# Patient Record
Sex: Male | Born: 1985 | Race: Black or African American | Hispanic: No | Marital: Single
Health system: Southern US, Community
[De-identification: ages and names within clinical notes are randomized; demographics above are authoritative.]

## PROBLEM LIST (undated history)

## (undated) DIAGNOSIS — Z789 Other specified health status: Secondary | ICD-10-CM

---
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
## 2011-03-01 ENCOUNTER — Emergency Department: Payer: Self-pay | Admitting: Emergency Medicine

## 2012-04-03 ENCOUNTER — Emergency Department: Payer: Self-pay | Admitting: Emergency Medicine

## 2012-07-16 ENCOUNTER — Emergency Department: Payer: Self-pay | Admitting: Emergency Medicine

## 2015-02-04 ENCOUNTER — Emergency Department: Payer: Self-pay | Admitting: Emergency Medicine

## 2015-02-08 ENCOUNTER — Emergency Department: Payer: Self-pay | Admitting: Emergency Medicine

## 2015-09-06 IMAGING — CR RIGHT ANKLE - COMPLETE 3+ VIEW
1 series · 3 of 3 positions shown · non-contrast
Comparison: None

CLINICAL DATA: Injured playing basketball today, swelling and pain
RIGHT ankle

EXAM:
RIGHT ANKLE - COMPLETE 3+ VIEW

[Series 1: x ankle ap right · 0.14mm/px · 3 of 3 slices shown]
[im 1/3]
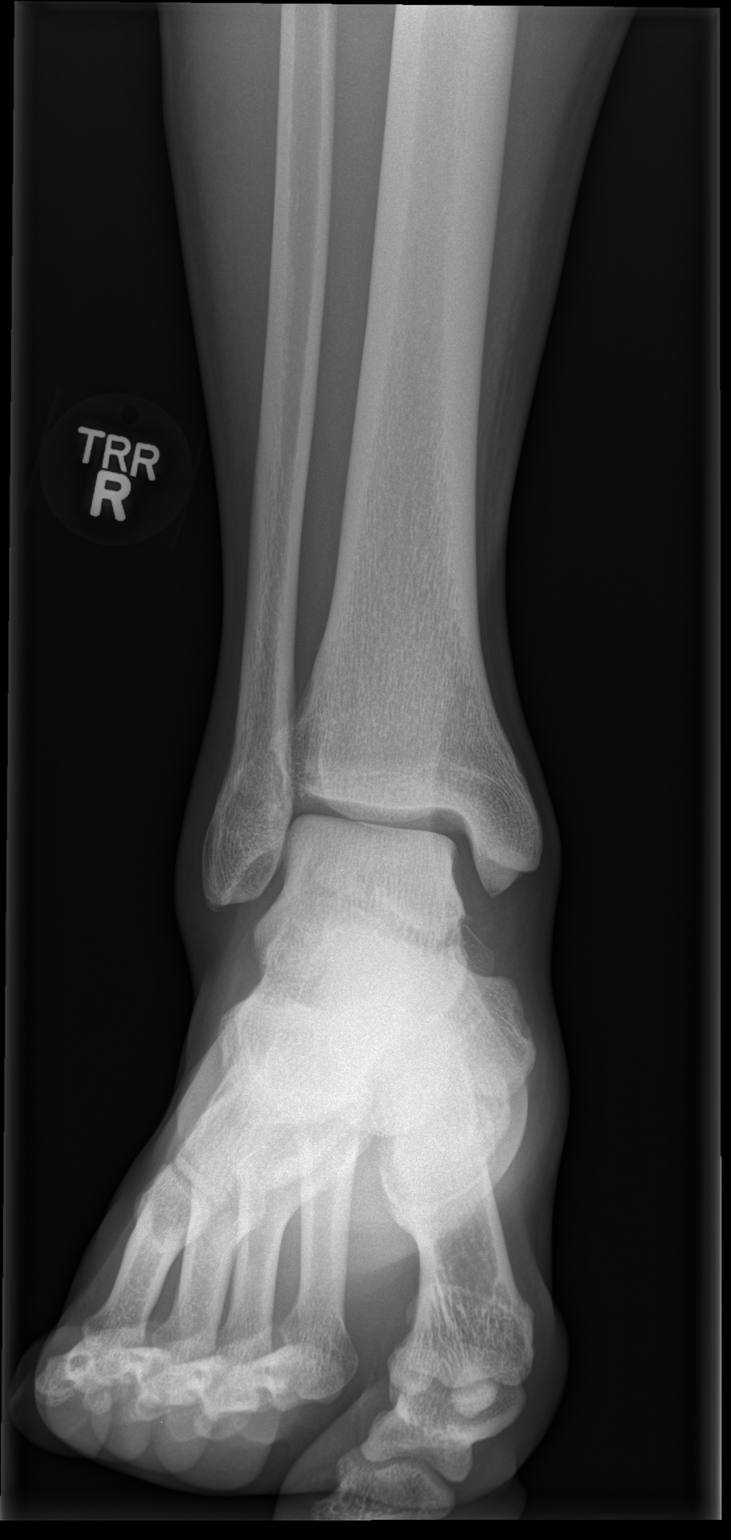
[im 2/3]
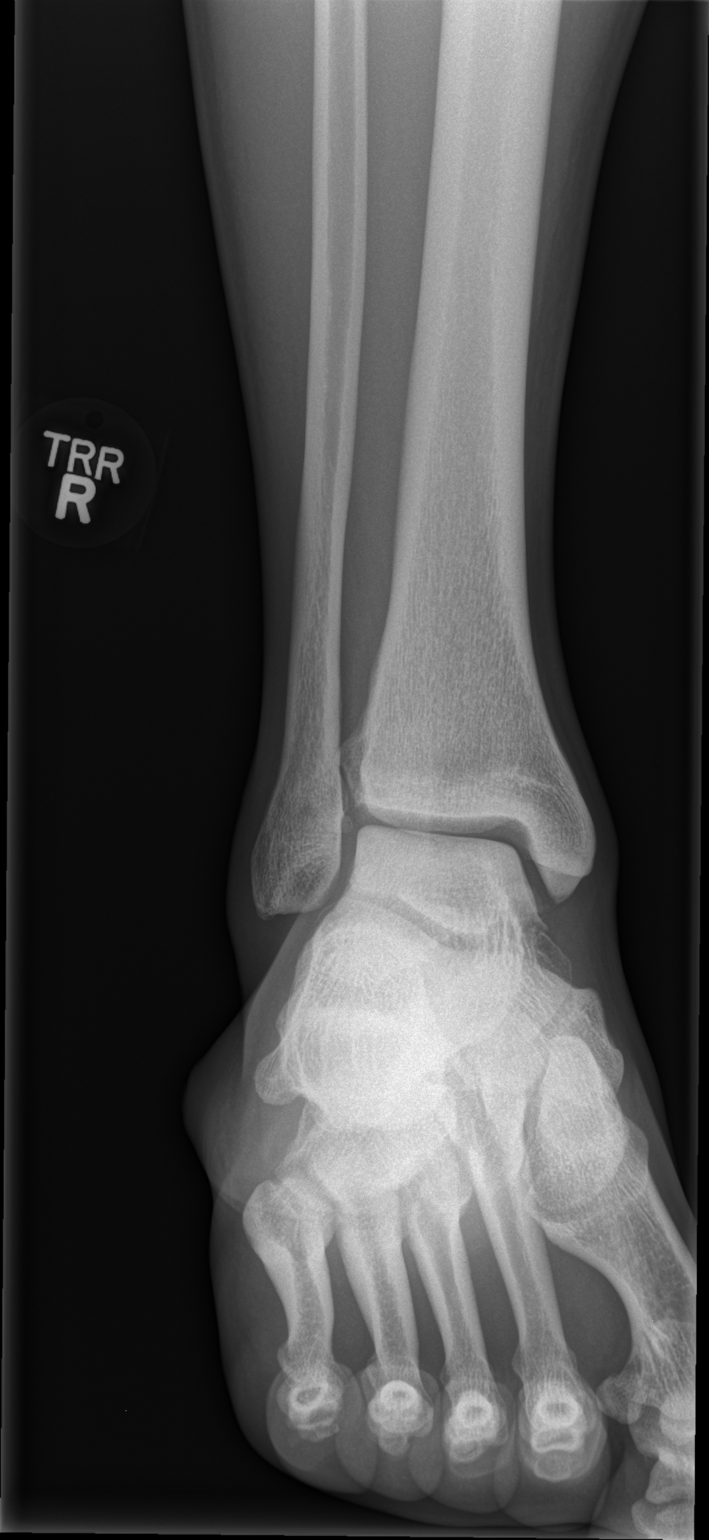
[im 3/3]
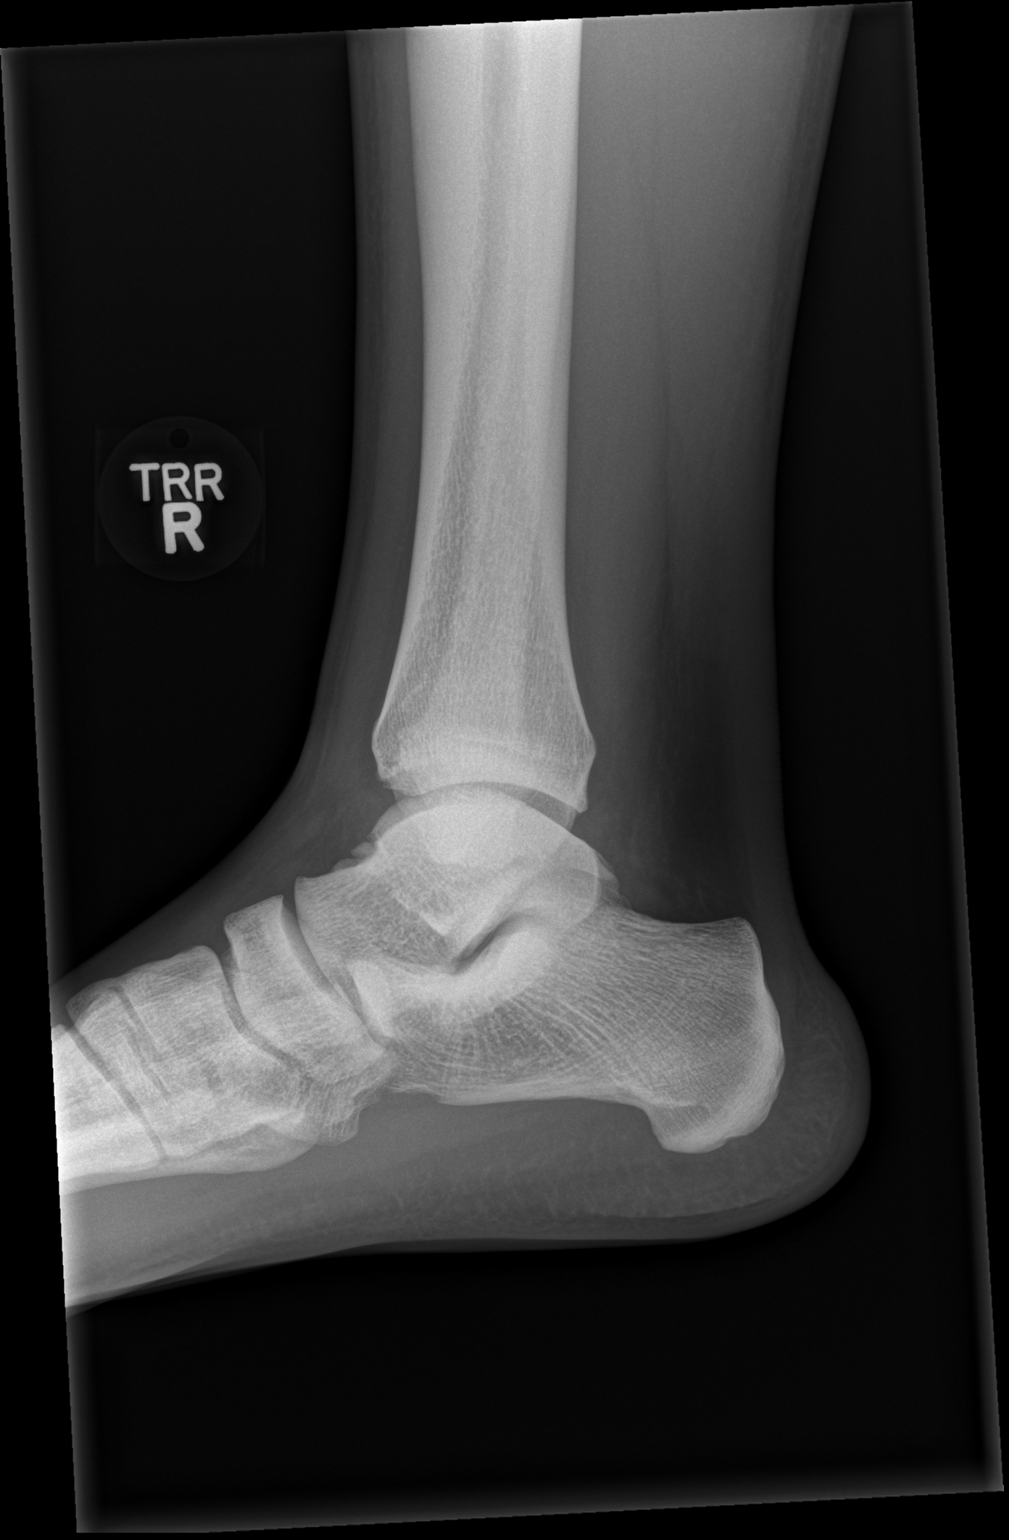

[3 of 3 positions shown; findings below may reference images not displayed]

FINDINGS: Osseous mineralization normal.

Ankle joint spaces preserved.

Minimal soft tissue swelling.

No definite acute fracture, dislocation, or bone destruction.
IMPRESSION: No acute osseous abnormalities.

## 2016-02-03 ENCOUNTER — Emergency Department
Admission: EM | Admit: 2016-02-03 | Discharge: 2016-02-03 | Disposition: A | Discharge status: Home / Self Care | Payer: Self-pay | Attending: Emergency Medicine | Admitting: Emergency Medicine

## 2016-02-03 ENCOUNTER — Emergency Department: Payer: Self-pay

## 2016-02-03 DIAGNOSIS — S86012A Strain of left Achilles tendon, initial encounter: Secondary | ICD-10-CM

## 2016-02-03 DIAGNOSIS — S86002A Unspecified injury of left Achilles tendon, initial encounter: Secondary | ICD-10-CM | POA: Insufficient documentation

## 2016-02-03 DIAGNOSIS — W500XXA Accidental hit or strike by another person, initial encounter: Secondary | ICD-10-CM | POA: Insufficient documentation

## 2016-02-03 DIAGNOSIS — Y998 Other external cause status: Secondary | ICD-10-CM | POA: Insufficient documentation

## 2016-02-03 DIAGNOSIS — Y92310 Basketball court as the place of occurrence of the external cause: Secondary | ICD-10-CM | POA: Insufficient documentation

## 2016-02-03 DIAGNOSIS — Y9367 Activity, basketball: Secondary | ICD-10-CM | POA: Insufficient documentation

## 2016-02-03 MED ORDER — HYDROCODONE-ACETAMINOPHEN 5-325 MG PO TABS
1.0000 | ORAL_TABLET | Freq: Once | ORAL | Status: AC
  Administered 2016-02-03: 1 via ORAL
  Filled 2016-02-03: qty 1

## 2016-02-03 MED ORDER — HYDROCODONE-ACETAMINOPHEN 5-325 MG PO TABS: 1.0000 | ORAL_TABLET | ORAL | Status: DC | PRN

## 2016-02-03 MED ORDER — MELOXICAM 15 MG PO TABS: 15.0000 mg | ORAL_TABLET | Freq: Every day | ORAL | Status: DC

## 2016-02-03 MED ORDER — IBUPROFEN 800 MG PO TABS
800.0000 mg | ORAL_TABLET | Freq: Once | ORAL | Status: AC
  Administered 2016-02-03: 800 mg via ORAL
  Filled 2016-02-03: qty 1

## 2016-02-03 NOTE — ED Notes (Addendum)
Pt arrives to ER c/o left foot pain that occurred with injury during basketball game. Pt alert and oriented X4, active, cooperative, pt in NAD. RR even and unlabored, color WNL.  Pt unable to stand on foot since injury. Pt requesting his sock to be cut off, done in triage. Left foot color WNL, pulse palpable and strong. Pt does report that it feels "tingly".

## 2016-02-03 NOTE — ED Provider Notes (Signed)
[facility] Emergency Department Provider Note ____________________________________________  Time seen: Approximately 8:27 PM  I have reviewed the triage vital signs and the nursing notes.   HISTORY  Chief Complaint Foot Pain    HPI Tanner Riley is a 30 y.o. male who presents to the emergency department for evaluation of left ankle pain. He states that while playing basketball tonight another person stepped on the back of his heel while he was attempting to turn and run with the basketball. He states that he felt a pop and was immediately unable to put weight on his foot. He denies previous ankle injury.  History reviewed. No pertinent past medical history.  There are no active problems to display for this patient.   History reviewed. No pertinent past surgical history.  Current Outpatient Rx  Name  Route  Sig  Dispense  Refill  . HYDROcodone-acetaminophen (NORCO/VICODIN) 5-325 MG tablet   Oral   Take 1 tablet by mouth every 4 (four) hours as needed for moderate pain.   20 tablet   0   . meloxicam (MOBIC) 15 MG tablet   Oral   Take 1 tablet (15 mg total) by mouth daily.   30 tablet   2     Allergies Review of patient's allergies indicates no known allergies.  No family history on file.  Social History Social History  Substance Use Topics  . Smoking status: Never Smoker   . Smokeless tobacco: None  . Alcohol Use: No    Review of Systems Constitutional: No recent illness. Cardiovascular: Denies chest pain or palpitations. Respiratory: Denies shortness of breath. Gastrointestinal: No abdominal pain.  Musculoskeletal: Pain in left heel/ankle Skin: Negative for abrasion or laceration. Neurological: Negative for focal weakness or numbness.   ____________________________________________   PHYSICAL EXAM:  VITAL SIGNS: ED Triage Vitals  Enc Vitals Group     BP 02/03/16 2003 147/63 mmHg     Pulse Rate 02/03/16 2003 86     Resp  02/03/16 2003 18     Temp 02/03/16 2003 99.5 F (37.5 C)     Temp Source 02/03/16 2003 Oral     SpO2 02/03/16 2003 96 %     Weight 02/03/16 2003 175 lb (79.379 kg)     Height 02/03/16 2003  (1.778 m)     Head Cir --      Peak Flow --      Pain Score 02/03/16 2004 7     Pain Loc --      Pain Edu? --      Excl. in GC? --     Constitutional: Alert and oriented. Well appearing and in no acute distress. Eyes: Conjunctivae are normal. EOMI. Head: Atraumatic. Nose: No congestion/rhinnorhea. Neck: Nexus criteria negative Respiratory: Normal respiratory effort.   Musculoskeletal: Janee Morn sign is positive on the left--no response. Janee Morn sign is normal on the right. Neurologic:  Normal speech and language. No gross focal neurologic deficits are appreciated. Speech is normal. Skin:  Skin is warm, dry and intact. Atraumatic. Psychiatric: Mood and affect are normal. Speech and behavior are normal.  ____________________________________________   LABS (all labs ordered are listed, but only abnormal results are displayed)  Labs Reviewed - No data to display ____________________________________________  RADIOLOGY  Abnormal Achilles shadow that suggests a tear that is consistent with clinical exam. I, Melyssa Signor, personally viewed and evaluated these images (plain radiographs) as part of my medical decision making, as well as reviewing the written report by the radiologist.  ____________________________________________   PROCEDURES  Procedure(s) performed:   Plantarflexion OCL was applied by the ER tech. Neurovascularly intact post-application.   ____________________________________________   INITIAL IMPRESSION / ASSESSMENT AND PLAN / ED COURSE  Pertinent labs & imaging results that were available during my care of the patient were reviewed by me and considered in my medical decision making (see chart for details).  Patient and mother were both instructed of the  importance of orthopedic follow-up. Mother reports that she has a another son that recently had surgery to repair his Achilles tendon tear, therefore she is familiar with the process.  Patient was strictly advised against weight-bearing and crutches were provided. He'll be given a prescription for meloxicam and Norco to be taken as prescribed for pain. He is to return to the emergency department for symptoms that change or worsen if he is unable to see an orthopedic specialist. ____________________________________________   FINAL CLINICAL IMPRESSION(S) / ED DIAGNOSES  Final diagnoses:  Achilles tendon rupture, left, initial encounter       Chinita Pester, FNP 02/03/16 2126  Jeanmarie Plant, MD 02/03/16 2212

## 2016-02-03 NOTE — ED Notes (Signed)
Pt reports injury to left foot while playing basketball, states felt pop in achilles area.  Pt states unable to bear weight, move ankle, foot, or toes, numbness to toes.  Pt NAD at this time

## 2016-02-03 NOTE — Discharge Instructions (Signed)
It is very important that you follow up with orthopedics. Call tomorrow for an appointment.

## 2016-02-08 ENCOUNTER — Encounter: Payer: Self-pay | Admitting: *Deleted

## 2016-02-11 ENCOUNTER — Ambulatory Visit: Payer: Self-pay | Admitting: Anesthesiology

## 2016-02-11 ENCOUNTER — Encounter
Admission: RE | Disposition: A | Discharge status: Home / Self Care | Payer: Self-pay | Source: Ambulatory Visit | Attending: Unknown Physician Specialty

## 2016-02-11 ENCOUNTER — Ambulatory Visit
Admission: RE | Admit: 2016-02-11 | Discharge: 2016-02-11 | Disposition: A | Discharge status: Home / Self Care | Payer: Self-pay | Source: Ambulatory Visit | Attending: Unknown Physician Specialty | Admitting: Unknown Physician Specialty

## 2016-02-11 DIAGNOSIS — Z79899 Other long term (current) drug therapy: Secondary | ICD-10-CM | POA: Insufficient documentation

## 2016-02-11 DIAGNOSIS — Y929 Unspecified place or not applicable: Secondary | ICD-10-CM | POA: Insufficient documentation

## 2016-02-11 DIAGNOSIS — S86012A Strain of left Achilles tendon, initial encounter: Secondary | ICD-10-CM | POA: Insufficient documentation

## 2016-02-11 DIAGNOSIS — Y9302 Activity, running: Secondary | ICD-10-CM | POA: Insufficient documentation

## 2016-02-11 DIAGNOSIS — Y9367 Activity, basketball: Secondary | ICD-10-CM | POA: Insufficient documentation

## 2016-02-11 HISTORY — PX: ACHILLES TENDON SURGERY: SHX542

## 2016-02-11 HISTORY — DX: Other specified health status: Z78.9

## 2016-02-11 SURGERY — REPAIR, TENDON, ACHILLES
Anesthesia: Regional | Laterality: Left | Wound class: Clean

## 2016-02-11 MED ORDER — PROPOFOL 10 MG/ML IV BOLUS
INTRAVENOUS | Status: DC | PRN
  Administered 2016-02-11: 200 mg via INTRAVENOUS

## 2016-02-11 MED ORDER — SUCCINYLCHOLINE CHLORIDE 20 MG/ML IJ SOLN
INTRAMUSCULAR | Status: DC | PRN
  Administered 2016-02-11: 120 mg via INTRAVENOUS

## 2016-02-11 MED ORDER — DEXAMETHASONE SODIUM PHOSPHATE 4 MG/ML IJ SOLN
INTRAMUSCULAR | Status: DC | PRN
  Administered 2016-02-11: 8 mg via INTRAVENOUS

## 2016-02-11 MED ORDER — LIDOCAINE HCL (CARDIAC) 20 MG/ML IV SOLN
INTRAVENOUS | Status: DC | PRN
  Administered 2016-02-11: 30 mg via INTRAVENOUS

## 2016-02-11 MED ORDER — MIDAZOLAM HCL 2 MG/2ML IJ SOLN
INTRAMUSCULAR | Status: DC | PRN
  Administered 2016-02-11 (×2): 2 mg via INTRAVENOUS

## 2016-02-11 MED ORDER — ROPIVACAINE HCL 5 MG/ML IJ SOLN
INTRAMUSCULAR | Status: DC | PRN
  Administered 2016-02-11: 20 mL via EPIDURAL

## 2016-02-11 MED ORDER — GLYCOPYRROLATE 0.2 MG/ML IJ SOLN
INTRAMUSCULAR | Status: DC | PRN
  Administered 2016-02-11: 0.2 mg via INTRAVENOUS

## 2016-02-11 MED ORDER — CEFAZOLIN SODIUM 1 G IJ SOLR
INTRAMUSCULAR | Status: DC | PRN
  Administered 2016-02-11: 2 g via INTRAMUSCULAR

## 2016-02-11 MED ORDER — LACTATED RINGERS IV SOLN
INTRAVENOUS | Status: DC
  Administered 2016-02-11: 09:00:00 via INTRAVENOUS

## 2016-02-11 MED ORDER — FENTANYL CITRATE (PF) 100 MCG/2ML IJ SOLN
INTRAMUSCULAR | Status: DC | PRN
  Administered 2016-02-11 (×2): 50 ug via INTRAVENOUS
  Administered 2016-02-11 (×2): 100 ug via INTRAVENOUS

## 2016-02-11 MED ORDER — ONDANSETRON HCL 4 MG/2ML IJ SOLN
INTRAMUSCULAR | Status: DC | PRN
  Administered 2016-02-11: 4 mg via INTRAVENOUS

## 2016-02-11 MED ORDER — NORCO 5-325 MG PO TABS: 1.0000 | ORAL_TABLET | Freq: Four times a day (QID) | ORAL | Status: AC | PRN

## 2016-02-11 SURGICAL SUPPLY — 36 items
115633 ×6 IMPLANT
BLADE CLIPPER SURG (BLADE) ×3 IMPLANT
BNDG ESMARK 4X12 TAN STRL LF (GAUZE/BANDAGES/DRESSINGS) ×3 IMPLANT
CANISTER SUCT 1200ML W/VALVE (MISCELLANEOUS) ×3 IMPLANT
CUFF TOURN SGL QUICK 30 (MISCELLANEOUS) ×2
CUFF TRNQT CYL LO 30X4X (MISCELLANEOUS) ×1 IMPLANT
DRAPE INCISE IOBAN 66X45 STRL (DRAPES) ×3 IMPLANT
DURAPREP 26ML APPLICATOR (WOUND CARE) ×6 IMPLANT
GAUZE SPONGE 4X4 12PLY STRL (GAUZE/BANDAGES/DRESSINGS) ×3 IMPLANT
GLOVE BIO SURGEON STRL SZ7.5 (GLOVE) ×6 IMPLANT
GLOVE BIO SURGEON STRL SZ8 (GLOVE) ×3 IMPLANT
GLOVE INDICATOR 8.0 STRL GRN (GLOVE) ×6 IMPLANT
GOWN STRL REUS W/ TWL LRG LVL3 (GOWN DISPOSABLE) ×2 IMPLANT
GOWN STRL REUS W/TWL LRG LVL3 (GOWN DISPOSABLE) ×4
HANDLE YANKAUER SUCT BULB TIP (MISCELLANEOUS) IMPLANT
KIT ROOM TURNOVER OR (KITS) ×3 IMPLANT
NDL MAYO CATGUT SZ5 (NEEDLE) ×4
NDL SUT 5 .5 CRC TPR PNT MAYO (NEEDLE) ×2 IMPLANT
NS IRRIG 500ML POUR BTL (IV SOLUTION) ×3 IMPLANT
PACK EXTREMITY ARMC (MISCELLANEOUS) ×3 IMPLANT
PAD CAST CTTN 4X4 STRL (SOFTGOODS) ×1 IMPLANT
PAD GROUND ADULT SPLIT (MISCELLANEOUS) ×3 IMPLANT
PADDING CAST 4IN STRL (MISCELLANEOUS) ×4
PADDING CAST BLEND 4X4 NS (MISCELLANEOUS) ×6 IMPLANT
PADDING CAST BLEND 4X4 STRL (MISCELLANEOUS) ×2 IMPLANT
PADDING CAST COTTON 4X4 STRL (SOFTGOODS) ×2
SCRUB POVIDONE IODINE 4 OZ (MISCELLANEOUS) ×3 IMPLANT
SPONGE LAP 18X18 5 PK (GAUZE/BANDAGES/DRESSINGS) ×3 IMPLANT
STOCKINETTE STRL 6IN 960660 (GAUZE/BANDAGES/DRESSINGS) ×3 IMPLANT
STRAP BODY AND KNEE 60X3 (MISCELLANEOUS) ×3 IMPLANT
SUT ETHIBOND NAB MO 7 #0 18IN (SUTURE) ×3 IMPLANT
SUT ETHILON 3 0 FSLX (SUTURE) ×6 IMPLANT
SUT VIC AB 0 CT1 36 (SUTURE) ×3 IMPLANT
SUT VIC AB 2-0 CT1 27 (SUTURE) ×2
SUT VIC AB 2-0 CT1 TAPERPNT 27 (SUTURE) ×1 IMPLANT
TAPE CAST 4X4 WHT DELT (MISCELLANEOUS) ×12 IMPLANT

## 2016-02-11 NOTE — Transfer of Care (Signed)
Immediate Anesthesia Transfer of Care Note  Patient: Tanner Riley  Procedure(s) Performed: Procedure(s) with comments: ACHILLES TENDON REPAIR (Left) - #5 non absorbable sutures needed requested by Dr Kernodle  Patient Location: PACU  Anesthesia Type: General, Regional  Level of Consciousness: awake, alert  and patient cooperative  Airway and Oxygen Therapy: Patient Spontanous Breathing and Patient connected to supplemental oxygen  Post-op Assessment: Post-op Vital signs reviewed, Patient's Cardiovascular Status Stable, Respiratory Function Stable, Patent Airway and No signs of Nausea or vomiting  Post-op Vital Signs: Reviewed and stable  Complications: No apparent anesthesia complications  

## 2016-02-11 NOTE — Op Note (Signed)
02/11/2016  3:17 PM  PATIENT:  Tanner Riley  30 y.o. male  PRE-OPERATIVE DIAGNOSIS:  ACHILLES TENDON RUPTURE LEFT  POST-OPERATIVE DIAGNOSIS:  ACHILLES TENDON RUPTURE LEFT  PROCEDURE:  Procedure(s) with comments: ACHILLES TENDON REPAIR (Left) - #5 non absorbable sutures needed requested by Dr Gavin Potters  SURGEON:   Erin Sons, Montez Hageman., MD  ASSISTANTS: None  ANESTHESIA: Popliteal block plus general  IMPLANTS: None  HISTORY: The patient sustained a rupture of his left Achilles tendon about a week prior to his surgery. He injured it playing basketball. He was scheduled for surgical repair at the time of his initial orthopedic office visit.  OP NOTE: While in the preop holding area the patient had a popliteal block on his left knee. He was then taken to the operating room where satisfactory general anesthesia was achieved. The patient was then turned to the prone position. The tourniquet was applied to his left thigh and then his left lower extremity was prepped and draped in usual fashion for procedure about the ankle. The patient incidentally was given 2 g of Kefzol IV prior to the start of the procedure.  A longitudinal incision was made just medial to his Achilles tendon rupture. I dissected down through the subcutaneous tissue onto the rupture itself. The rupture occurred about 2 inches proximal to the Achilles tendon insertion on the os calcis. I went ahead and debrided the ruptured tendon ends. I then repaired the tendon rupture end to end using #5 nonabsorbable suture in a whipstitch fashion. I reinforced the repair with several 0 Vicryl sutures placed in simple suture fashion. I then closed the subcutaneous tissue with 2-0 Vicryl and the skin with 3-0 nylon sutures in vertical mattress fashion. A sterile dressing was applied and then a short leg cast was applied with the left foot plantarflexed. The patient was then turned applied as he was moved over to a stretcher bed. He was then  awakened and taken to the recovery room in satisfactory condition. The tourniquet, incidentally, was not inflated during the course of the procedure. Blood loss was negligible.        the remaining

## 2016-02-11 NOTE — H&P (Signed)
  H and P reviewed. No changes. Uploaded at later date. 

## 2016-02-11 NOTE — Discharge Instructions (Signed)
General Anesthesia, Adult, Care After Refer to this sheet in the next few weeks. These instructions provide you with information on caring for yourself after your procedure. Your health care provider may also give you more specific instructions. Your treatment has been planned according to current medical practices, but problems sometimes occur. Call your health care provider if you have any problems or questions after your procedure. WHAT TO EXPECT AFTER THE PROCEDURE After the procedure, it is typical to experience:  Sleepiness.  Nausea and vomiting. HOME CARE INSTRUCTIONS  For the first 24 hours after general anesthesia:  Have a responsible person with you.  Do not drive a car. If you are alone, do not take public transportation.  Do not drink alcohol.  Do not take medicine that has not been prescribed by your health care provider.  Do not sign important papers or make important decisions.  You may resume a normal diet and activities as directed by your health care provider.  Change bandages (dressings) as directed.  If you have questions or problems that seem related to general anesthesia, call the hospital and ask for the anesthetist or anesthesiologist on call. SEEK MEDICAL CARE IF:  You have nausea and vomiting that continue the day after anesthesia.  You develop a rash. SEEK IMMEDIATE MEDICAL CARE IF:   You have difficulty breathing.  You have chest pain.  You have any allergic problems.   This information is not intended to replace advice given to you by your health care provider. Make sure you discuss any questions you have with your health care provider.   Document Released: 03/12/2001 Document Revised: 12/25/2014 Document Reviewed: 04/03/2012 Elsevier Interactive Patient Education 2016 [employer]   Crutches NWB on L  Elevation  Ice pack on back of ankle    Keep cast dry  RTC in 2 weeks

## 2016-02-11 NOTE — Progress Notes (Signed)
Assisted Christopher Gratian ANMD with left, popliteal block. Side rails up, monitors on throughout procedure. See vital signs in flow sheet. Tolerated Procedure well.  

## 2016-02-11 NOTE — Anesthesia Preprocedure Evaluation (Signed)
Anesthesia Evaluation  Patient identified by MRN, date of birth, ID band Patient awake    Reviewed: Allergy & Precautions, NPO status , Patient's Chart, lab work & pertinent test results  Airway Mallampati: I  TM Distance: >3 FB Neck ROM: Full    Dental no notable dental hx.    Pulmonary neg pulmonary ROS,    Pulmonary exam normal breath sounds clear to auscultation       Cardiovascular negative cardio ROS Normal cardiovascular exam Rhythm:Regular Rate:Normal     Neuro/Psych negative neurological ROS  negative psych ROS   GI/Hepatic negative GI ROS, Neg liver ROS,   Endo/Other  negative endocrine ROS  Renal/GU negative Renal ROS  negative genitourinary   Musculoskeletal negative musculoskeletal ROS (+)   Abdominal   Peds negative pediatric ROS (+)  Hematology negative hematology ROS (+)   Anesthesia Other Findings   Reproductive/Obstetrics negative OB ROS                             Anesthesia Physical Anesthesia Plan  ASA: I  Anesthesia Plan: General and Regional   Post-op Pain Management:    Induction: Intravenous  Airway Management Planned:   Additional Equipment:   Intra-op Plan:   Post-operative Plan: Extubation in OR  Informed Consent: I have reviewed the patients History and Physical, chart, labs and discussed the procedure including the risks, benefits and alternatives for the proposed anesthesia with the patient or authorized representative who has indicated his/her understanding and acceptance.   Dental advisory given  Plan Discussed with: CRNA  Anesthesia Plan Comments:         Anesthesia Quick Evaluation

## 2016-02-11 NOTE — Transfer of Care (Signed)
Immediate Anesthesia Transfer of Care Note  Patient: Tanner Riley  Procedure(s) Performed: Procedure(s) with comments: ACHILLES TENDON REPAIR (Left) - #5 non absorbable sutures needed requested by Dr Gavin Potters  Patient Location: PACU  Anesthesia Type: General, Regional  Level of Consciousness: awake, alert  and patient cooperative  Airway and Oxygen Therapy: Patient Spontanous Breathing and Patient connected to supplemental oxygen  Post-op Assessment: Post-op Vital signs reviewed, Patient's Cardiovascular Status Stable, Respiratory Function Stable, Patent Airway and No signs of Nausea or vomiting  Post-op Vital Signs: Reviewed and stable  Complications: No apparent anesthesia complications

## 2016-02-11 NOTE — Anesthesia Postprocedure Evaluation (Signed)
Anesthesia Post Note  Patient: Tanner Riley  Procedure(s) Performed: Procedure(s) (LRB): ACHILLES TENDON REPAIR (Left)  Patient location during evaluation: PACU Anesthesia Type: General and Regional Level of consciousness: awake and alert Pain management: pain level controlled Vital Signs Assessment: post-procedure vital signs reviewed and stable Respiratory status: spontaneous breathing, nonlabored ventilation, respiratory function stable and patient connected to nasal cannula oxygen Cardiovascular status: blood pressure returned to baseline and stable Postop Assessment: no signs of nausea or vomiting Anesthetic complications: no    Henretter Piekarski C

## 2016-02-11 NOTE — Anesthesia Procedure Notes (Addendum)
Anesthesia Regional Block:  Popliteal block  Pre-Anesthetic Checklist: ,, timeout performed, Correct Patient, Correct Site, Correct Laterality, Correct Procedure, Correct Position, site marked, Risks and benefits discussed,  Surgical consent,  Pre-op evaluation,  At surgeon's request and post-op pain management  Laterality: Left  Prep: chloraprep       Needles:  Injection technique: Single-shot  Needle Type: Echogenic Needle     Needle Length: 9cm 9 cm Needle Gauge: 21 and 21 G    Additional Needles:  Procedures: ultrasound guided (picture in chart) and nerve stimulator Popliteal block Narrative:  Injection made incrementally with aspirations every 5 mL.  Performed by: Personally   Additional Notes: Functioning IV was confirmed and monitors applied. Ultrasound guidance: relevant anatomy identified, needle position confirmed, local anesthetic spread visualized around nerve(s)., vascular puncture avoided.  Image printed for medical record.  Negative aspiration and no paresthesias; incremental administration of local anesthetic. The patient tolerated the procedure well. Vitals signes recorded in RN notes. No complications. No needle to nerve contact.   Procedure Name: Intubation Performed by: Theone Murdoch Pre-anesthesia Checklist: Patient identified, Emergency Drugs available and Suction available Patient Re-evaluated:Patient Re-evaluated prior to inductionOxygen Delivery Method: Circle system utilized Preoxygenation: Pre-oxygenation with 100% oxygen Intubation Type: IV induction Ventilation: Mask ventilation without difficulty Laryngoscope Size: Mac and 3 Grade View: Grade I Tube type: Oral Tube size: 7.5 mm Number of attempts: 1 Airway Equipment and Method: Stylet Placement Confirmation: ETT inserted through vocal cords under direct vision,  positive ETCO2 and breath sounds checked- equal and bilateral Secured at: 21 cm Tube secured with: Tape Dental Injury: Teeth and  Oropharynx as per pre-operative assessment

## 2016-02-14 ENCOUNTER — Encounter: Payer: Self-pay | Admitting: Unknown Physician Specialty

## 2016-09-04 IMAGING — CR DG ANKLE COMPLETE 3+V*L*
1 series · 3 of 3 positions shown · non-contrast
Comparison: None.

CLINICAL DATA: 29-year-old male with left ankle injury.

EXAM:
LEFT ANKLE COMPLETE - 3+ VIEW

[Series 1: dg ankle complete left · 0.14mm/px · 3 of 3 slices shown]
[im 1/3]
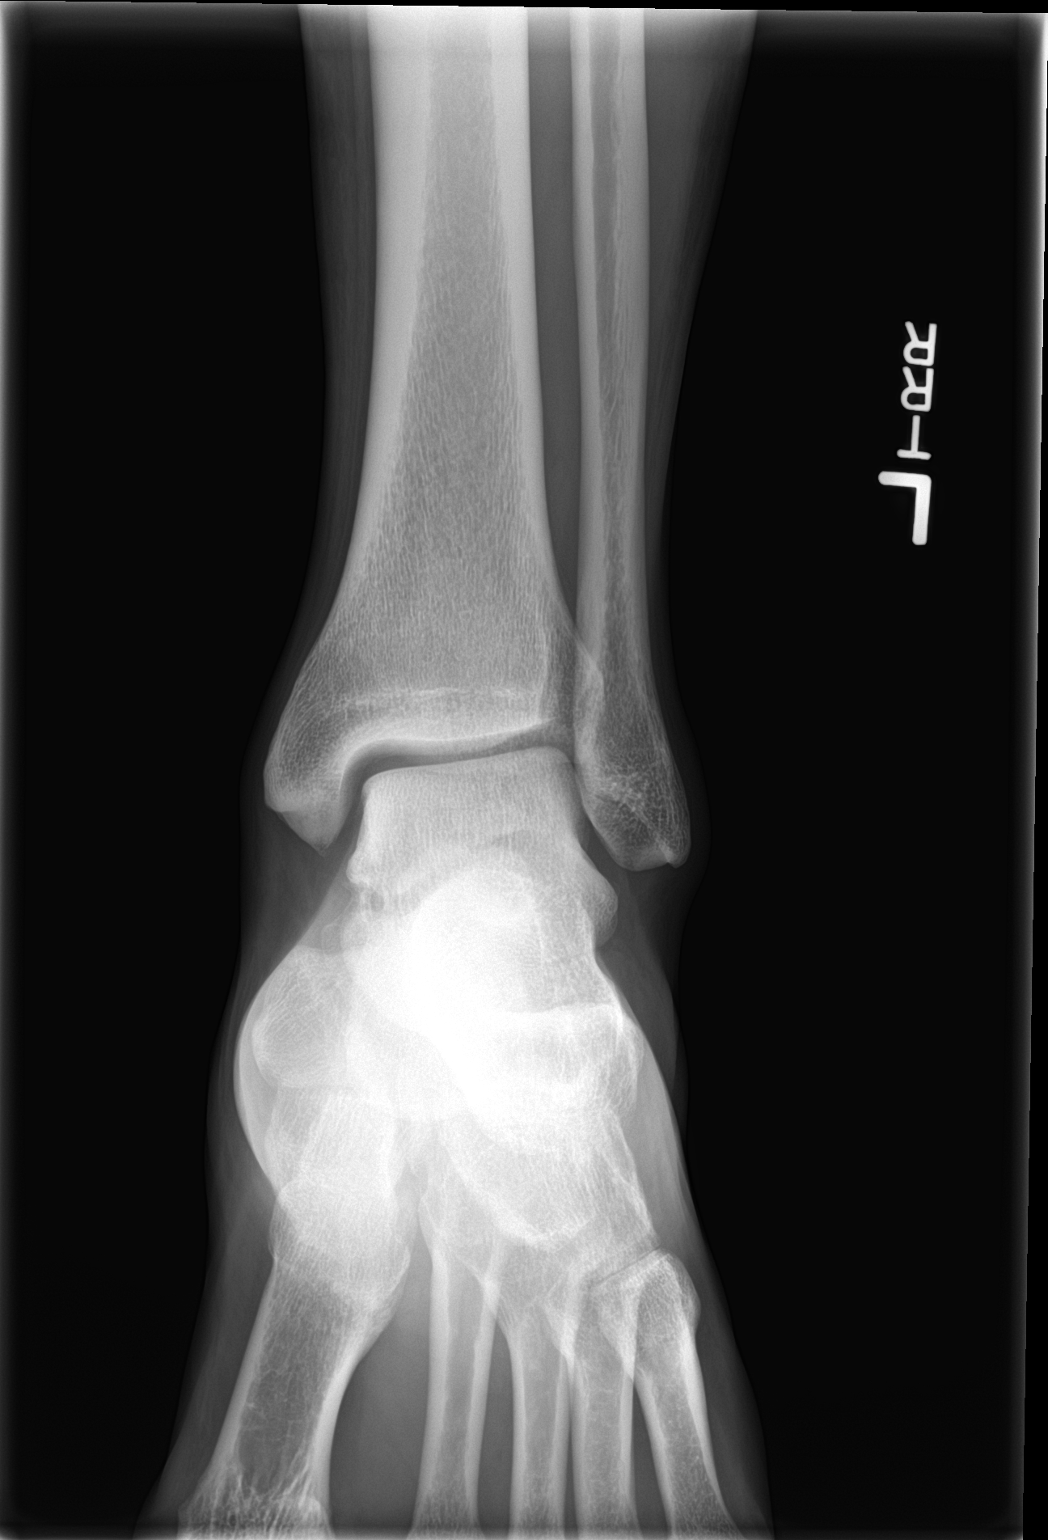
[im 2/3]
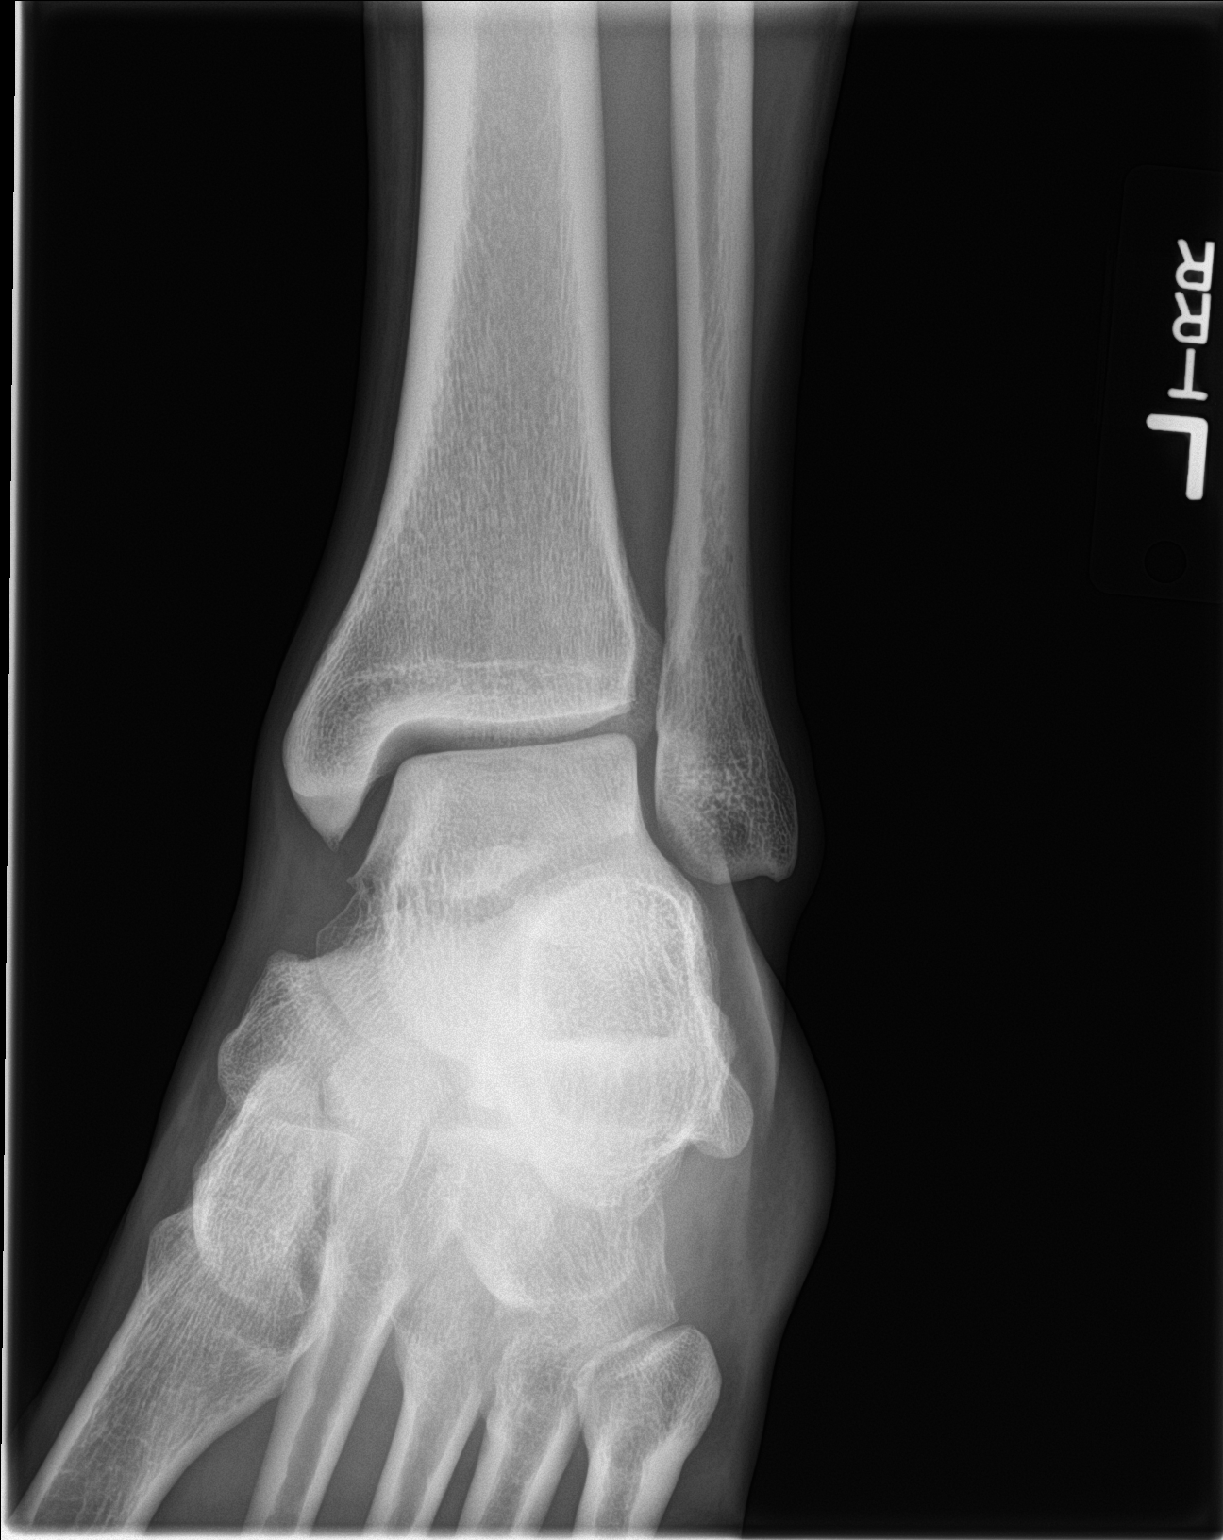
[im 3/3]
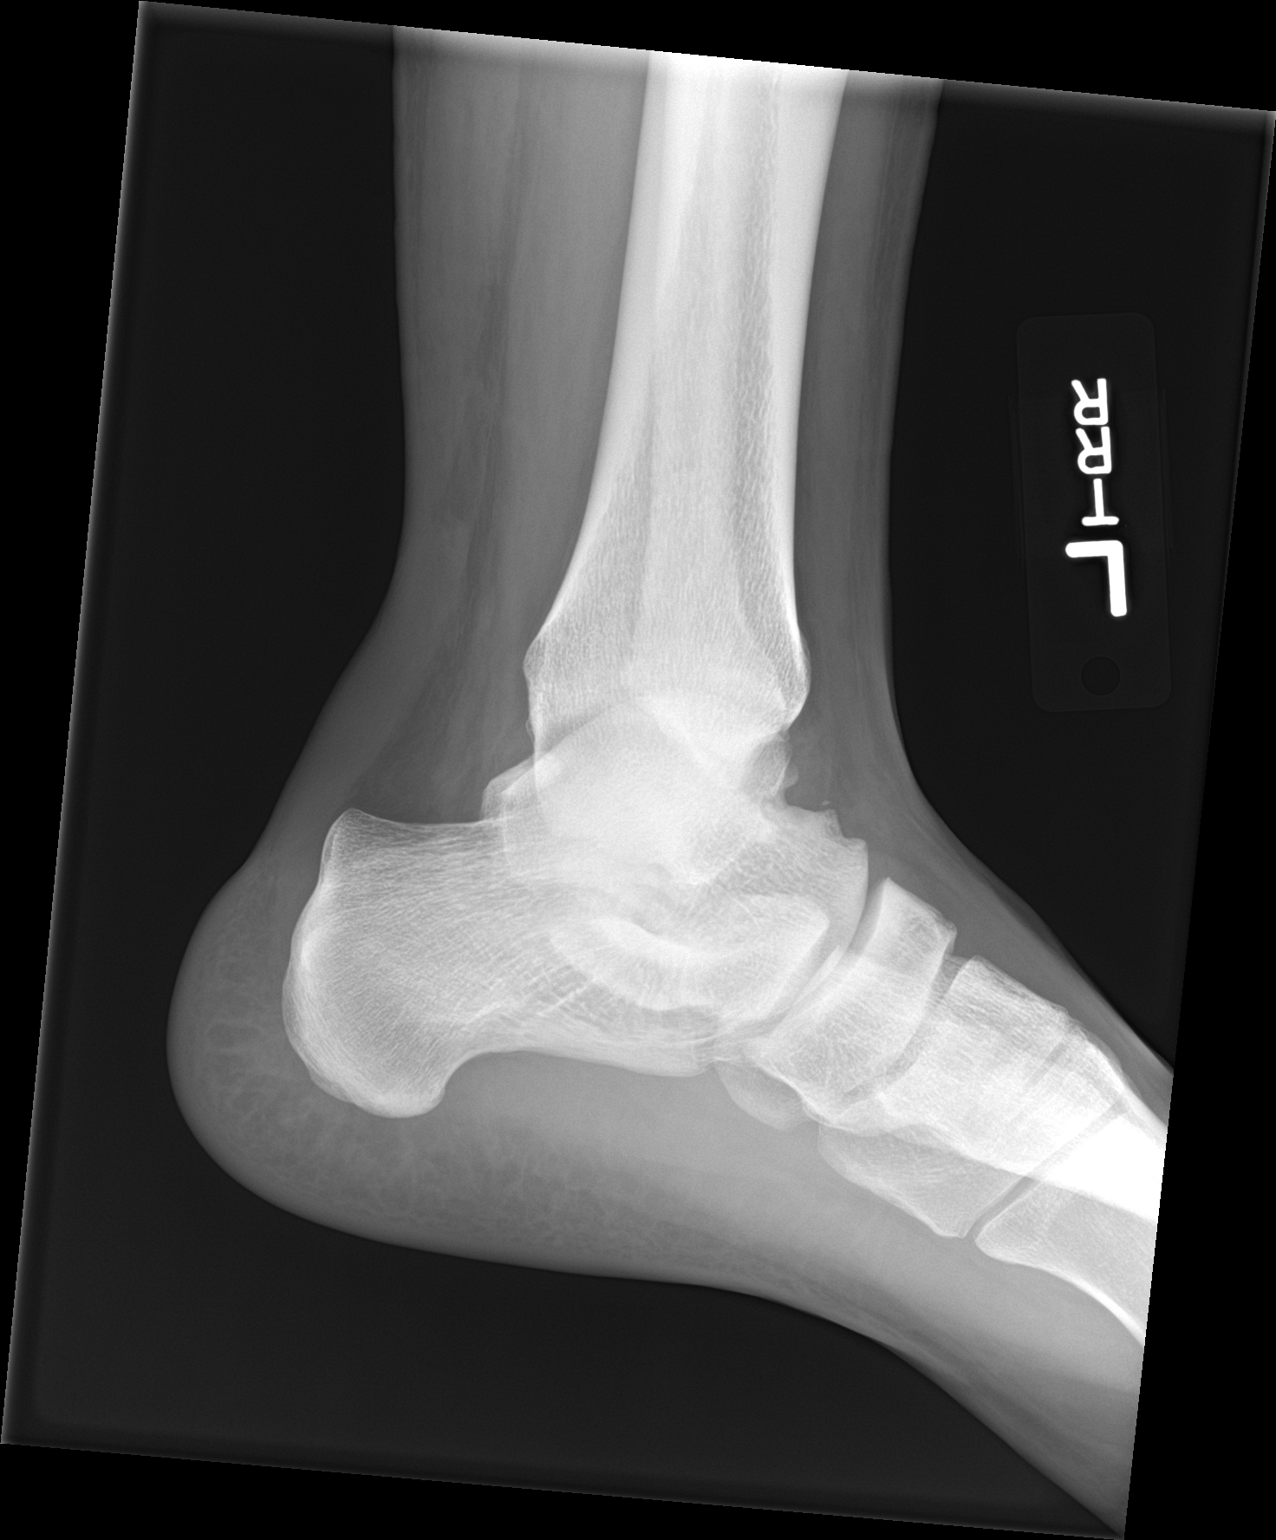

[3 of 3 positions shown; findings below may reference images not displayed]

FINDINGS: There is no evidence of fracture, dislocation, or joint effusion.
There is no evidence of arthropathy or other focal bone abnormality.
Soft tissues are unremarkable.
IMPRESSION: Negative.

## 2020-07-09 ENCOUNTER — Ambulatory Visit: Payer: HRSA Program | Attending: Internal Medicine

## 2020-07-09 DIAGNOSIS — Z20822 Contact with and (suspected) exposure to covid-19: Secondary | ICD-10-CM | POA: Diagnosis not present

## 2020-07-10 LAB — SARS-COV-2, NAA 2 DAY TAT

## 2020-07-10 LAB — NOVEL CORONAVIRUS, NAA: SARS-CoV-2, NAA: NOT DETECTED

## 2023-06-04 ENCOUNTER — Encounter: Payer: Self-pay | Admitting: Internal Medicine
